# Patient Record
Sex: Female | Born: 1990 | Race: White | Hispanic: No | Marital: Married | Smoking: Never smoker
Health system: Southern US, Community
[De-identification: ages and names within clinical notes are randomized; demographics above are authoritative.]

## PROBLEM LIST (undated history)

## (undated) ENCOUNTER — Encounter (HOSPITAL_COMMUNITY): Payer: Self-pay

## (undated) DIAGNOSIS — Z789 Other specified health status: Secondary | ICD-10-CM

## (undated) HISTORY — PX: WISDOM TOOTH EXTRACTION: SHX21

---

## 2014-02-15 ENCOUNTER — Encounter: Payer: Self-pay | Admitting: Medical

## 2014-02-20 LAB — OB RESULTS CONSOLE GC/CHLAMYDIA
Chlamydia: NEGATIVE
GC PROBE AMP, GENITAL: NEGATIVE

## 2014-03-20 LAB — OB RESULTS CONSOLE HEPATITIS B SURFACE ANTIGEN: Hepatitis B Surface Ag: NEGATIVE

## 2014-03-20 LAB — OB RESULTS CONSOLE ABO/RH: RH Type: POSITIVE

## 2014-03-20 LAB — OB RESULTS CONSOLE RPR: RPR: NONREACTIVE

## 2014-03-20 LAB — OB RESULTS CONSOLE HIV ANTIBODY (ROUTINE TESTING): HIV: NONREACTIVE

## 2014-03-20 LAB — OB RESULTS CONSOLE RUBELLA ANTIBODY, IGM: Rubella: IMMUNE

## 2014-07-18 ENCOUNTER — Inpatient Hospital Stay (HOSPITAL_COMMUNITY)
Admission: AD | Admit: 2014-07-18 | Discharge: 2014-07-18 | Disposition: A | Discharge status: Home / Self Care | Payer: 59 | Source: Ambulatory Visit | Attending: Obstetrics and Gynecology | Admitting: Obstetrics and Gynecology

## 2014-07-18 ENCOUNTER — Encounter (HOSPITAL_COMMUNITY): Payer: Self-pay | Admitting: *Deleted

## 2014-07-18 DIAGNOSIS — N898 Other specified noninflammatory disorders of vagina: Secondary | ICD-10-CM | POA: Insufficient documentation

## 2014-07-18 DIAGNOSIS — O9989 Other specified diseases and conditions complicating pregnancy, childbirth and the puerperium: Secondary | ICD-10-CM | POA: Diagnosis not present

## 2014-07-18 DIAGNOSIS — O26893 Other specified pregnancy related conditions, third trimester: Secondary | ICD-10-CM

## 2014-07-18 DIAGNOSIS — O99891 Other specified diseases and conditions complicating pregnancy: Secondary | ICD-10-CM | POA: Insufficient documentation

## 2014-07-18 HISTORY — DX: Other specified health status: Z78.9

## 2014-07-18 NOTE — MAU Note (Signed)
?   LOF for the past 2 weeks, is becoming more frequent & larger amount of fluid, yellowish tint.  Denies bleeding or pain.

## 2014-07-18 NOTE — MAU Provider Note (Signed)
  History     CSN: [id number]  Arrival date and time: 07/18/14 1653   None     Chief Complaint  Patient presents with  . Rupture of Membranes   HPI This is a 23 y.o. female at 7553w0d who presents with c/o leaking thin fluid for 2 days with yellow color. Denies bleeding or pain. Feels lots of fetal movement.   RN Note:  ? LOF for the past 2 weeks, is becoming more frequent & larger amount of fluid, yellowish tint. Denies bleeding or pain.       OB History   Grav Para Term Preterm Abortions TAB SAB Ect Mult Living   1               Past Medical History  Diagnosis Date  . Medical history non-contributory     Past Surgical History  Procedure Laterality Date  . Wisdom tooth extraction      History reviewed. No pertinent family history.  History  Substance Use Topics  . Smoking status: Never Smoker   . Smokeless tobacco: Not on file  . Alcohol Use: No    Allergies: Allergies not on file  No prescriptions prior to admission    Review of Systems  Constitutional: Negative for fever, chills and malaise/fatigue.  Gastrointestinal: Negative for nausea, vomiting and abdominal pain.  Genitourinary:       Vaginal discharge  Neurological: Negative for dizziness and headaches.   Physical Exam   Blood pressure 106/65, pulse 70, temperature 98.6 F (37 C), temperature source Oral, resp. rate 16.  Physical Exam  Constitutional: She is oriented to person, place, and time. She appears well-developed and well-nourished. No distress.  Cardiovascular: Normal rate.   Respiratory: Effort normal.  GI: Soft. There is no tenderness. There is no rebound and no guarding.  Genitourinary: Uterus normal. Vaginal discharge (thin white fluid in vault, small pool, NEGATIVE ferning) found.  Cervix appears closed   Musculoskeletal: Normal range of motion.  Neurological: She is alert and oriented to person, place, and time.  Skin: Skin is warm and dry.  Psychiatric: She has a normal  mood and affect.   FHR reassuring Some intermittent uterine irritability, not sensed by patient  Bimanual:  Cervix closed Dilation: Closed Effacement (%): Thick Cervical Position: Posterior Presentation: Undeterminable Exam by:: Mayford KnifeWilliams CNM  MAU Course  Procedures  MDM Negative Ferning  Assessment and Plan  A:  SIUP at 5453w0d       Vaginal discharge, no evidence of PPROM  P:  Discharge home       Dr Dareen PianoAnderson notified       Has appt next week in office  [facility] LLCWILLIAMS,Britnie Colville 07/18/2014, 5:38 PM

## 2014-07-18 NOTE — Discharge Instructions (Signed)
Third Trimester of Pregnancy The third trimester is from week 29 through week 42, months 7 through 9. The third trimester is a time when the fetus is growing rapidly. At the end of the ninth month, the fetus is about 20 inches in length and weighs 6-10 pounds.  BODY CHANGES Your body goes through many changes during pregnancy. The changes vary from woman to woman.   Your weight will continue to increase. You can expect to gain 25-35 pounds (11-16 kg) by the end of the pregnancy.  You may begin to get stretch marks on your hips, abdomen, and breasts.  You may urinate more often because the fetus is moving lower into your pelvis and pressing on your bladder.  You may develop or continue to have heartburn as a result of your pregnancy.  You may develop constipation because certain hormones are causing the muscles that push waste through your intestines to slow down.  You may develop hemorrhoids or swollen, bulging veins (varicose veins).  You may have pelvic pain because of the weight gain and pregnancy hormones relaxing your joints between the bones in your pelvis. Backaches may result from overexertion of the muscles supporting your posture.  You may have changes in your hair. These can include thickening of your hair, rapid growth, and changes in texture. Some women also have hair loss during or after pregnancy, or hair that feels dry or thin. Your hair will most likely return to normal after your baby is born.  Your breasts will continue to grow and be tender. A yellow discharge may leak from your breasts called colostrum.  Your belly button may stick out.  You may feel short of breath because of your expanding uterus.  You may notice the fetus "dropping," or moving lower in your abdomen.  You may have a bloody mucus discharge. This usually occurs a few days to a week before labor begins.  Your cervix becomes thin and soft (effaced) near your due date. WHAT TO EXPECT AT YOUR PRENATAL  EXAMS  You will have prenatal exams every 2 weeks until week 36. Then, you will have weekly prenatal exams. During a routine prenatal visit:  You will be weighed to make sure you and the fetus are growing normally.  Your blood pressure is taken.  Your abdomen will be measured to track your baby's growth.  The fetal heartbeat will be listened to.  Any test results from the previous visit will be discussed.  You may have a cervical check near your due date to see if you have effaced. At around 36 weeks, your caregiver will check your cervix. At the same time, your caregiver will also perform a test on the secretions of the vaginal tissue. This test is to determine if a type of bacteria, Group B streptococcus, is present. Your caregiver will explain this further. Your caregiver may ask you:  What your birth plan is.  How you are feeling.  If you are feeling the baby move.  If you have had any abnormal symptoms, such as leaking fluid, bleeding, severe headaches, or abdominal cramping.  If you have any questions. Other tests or screenings that may be performed during your third trimester include:  Blood tests that check for low iron levels (anemia).  Fetal testing to check the health, activity level, and growth of the fetus. Testing is done if you have certain medical conditions or if there are problems during the pregnancy. FALSE LABOR You may feel small, irregular contractions that   eventually go away. These are called Braxton Hicks contractions, or false labor. Contractions may last for hours, days, or even weeks before true labor sets in. If contractions come at regular intervals, intensify, or become painful, it is best to be seen by your caregiver.  SIGNS OF LABOR   Menstrual-like cramps.  Contractions that are 5 minutes apart or less.  Contractions that start on the top of the uterus and spread down to the lower abdomen and back.  A sense of increased pelvic pressure or back  pain.  A watery or bloody mucus discharge that comes from the vagina. If you have any of these signs before the 37th week of pregnancy, call your caregiver right away. You need to go to the hospital to get checked immediately. HOME CARE INSTRUCTIONS   Avoid all smoking, herbs, alcohol, and unprescribed drugs. These chemicals affect the formation and growth of the baby.  Follow your caregiver's instructions regarding medicine use. There are medicines that are either safe or unsafe to take during pregnancy.  Exercise only as directed by your caregiver. Experiencing uterine cramps is a good sign to stop exercising.  Continue to eat regular, healthy meals.  Wear a good support bra for breast tenderness.  Do not use hot tubs, steam rooms, or saunas.  Wear your seat belt at all times when driving.  Avoid raw meat, uncooked cheese, cat litter boxes, and soil used by cats. These carry germs that can cause birth defects in the baby.  Take your prenatal vitamins.  Try taking a stool softener (if your caregiver approves) if you develop constipation. Eat more high-fiber foods, such as fresh vegetables or fruit and whole grains. Drink plenty of fluids to keep your urine clear or pale yellow.  Take warm sitz baths to soothe any pain or discomfort caused by hemorrhoids. Use hemorrhoid cream if your caregiver approves.  If you develop varicose veins, wear support hose. Elevate your feet for 15 minutes, 3-4 times a day. Limit salt in your diet.  Avoid heavy lifting, wear low heal shoes, and practice good posture.  Rest a lot with your legs elevated if you have leg cramps or low back pain.  Visit your dentist if you have not gone during your pregnancy. Use a soft toothbrush to brush your teeth and be gentle when you floss.  A sexual relationship may be continued unless your caregiver directs you otherwise.  Do not travel far distances unless it is absolutely necessary and only with the approval  of your caregiver.  Take prenatal classes to understand, practice, and ask questions about the labor and delivery.  Make a trial run to the hospital.  Pack your hospital bag.  Prepare the baby's nursery.  Continue to go to all your prenatal visits as directed by your caregiver. SEEK MEDICAL CARE IF:  You are unsure if you are in labor or if your water has broken.  You have dizziness.  You have mild pelvic cramps, pelvic pressure, or nagging pain in your abdominal area.  You have persistent nausea, vomiting, or diarrhea.  You have a bad smelling vaginal discharge.  You have pain with urination. SEEK IMMEDIATE MEDICAL CARE IF:   You have a fever.  You are leaking fluid from your vagina.  You have spotting or bleeding from your vagina.  You have severe abdominal cramping or pain.  You have rapid weight loss or gain.  You have shortness of breath with chest pain.  You notice sudden or extreme swelling   of your face, hands, ankles, feet, or legs.  You have not felt your baby move in over an hour.  You have severe headaches that do not go away with medicine.  You have vision changes. Document Released: 11/23/2001 Document Revised: 12/04/2013 Document Reviewed: 01/30/2013 ExitCare Patient Information 2015 [employer]. This information is not intended to replace advice given to you by your health care provider. Make sure you discuss any questions you have with your health care provider.  

## 2014-09-10 LAB — OB RESULTS CONSOLE GBS: GBS: NEGATIVE

## 2014-10-14 ENCOUNTER — Encounter (HOSPITAL_COMMUNITY): Payer: Self-pay | Admitting: *Deleted

## 2014-10-17 ENCOUNTER — Encounter (HOSPITAL_COMMUNITY): Payer: Self-pay

## 2014-10-17 ENCOUNTER — Inpatient Hospital Stay (HOSPITAL_COMMUNITY)
Admission: RE | Admit: 2014-10-17 | Discharge: 2014-10-20 | DRG: 775 | Disposition: A | Discharge status: Home / Self Care | Payer: BC Managed Care – PPO | Source: Ambulatory Visit | Attending: Obstetrics & Gynecology | Admitting: Obstetrics & Gynecology

## 2014-10-17 DIAGNOSIS — Z8759 Personal history of other complications of pregnancy, childbirth and the puerperium: Secondary | ICD-10-CM

## 2014-10-17 DIAGNOSIS — Z3A41 41 weeks gestation of pregnancy: Secondary | ICD-10-CM | POA: Diagnosis present

## 2014-10-17 DIAGNOSIS — IMO0001 Reserved for inherently not codable concepts without codable children: Secondary | ICD-10-CM

## 2014-10-17 DIAGNOSIS — O480 Post-term pregnancy: Principal | ICD-10-CM | POA: Diagnosis present

## 2014-10-17 LAB — CBC
HCT: 34.6 % — ABNORMAL LOW (ref 36.0–46.0)
Hemoglobin: 11.8 g/dL — ABNORMAL LOW (ref 12.0–15.0)
MCH: 31.7 pg (ref 26.0–34.0)
MCHC: 34.1 g/dL (ref 30.0–36.0)
MCV: 93 fL (ref 78.0–100.0)
PLATELETS: 215 10*3/uL (ref 150–400)
RBC: 3.72 MIL/uL — ABNORMAL LOW (ref 3.87–5.11)
RDW: 13.3 % (ref 11.5–15.5)
WBC: 15.6 10*3/uL — ABNORMAL HIGH (ref 4.0–10.5)

## 2014-10-17 MED ORDER — FLEET ENEMA 7-19 GM/118ML RE ENEM: 1.0000 | ENEMA | RECTAL | Status: DC | PRN

## 2014-10-17 MED ORDER — LACTATED RINGERS IV SOLN
INTRAVENOUS | Status: DC
  Administered 2014-10-17: 22:00:00 via INTRAVENOUS

## 2014-10-17 MED ORDER — OXYCODONE-ACETAMINOPHEN 5-325 MG PO TABS: 1.0000 | ORAL_TABLET | ORAL | Status: DC | PRN

## 2014-10-17 MED ORDER — CITRIC ACID-SODIUM CITRATE 334-500 MG/5ML PO SOLN: 30.0000 mL | ORAL | Status: DC | PRN

## 2014-10-17 MED ORDER — OXYCODONE-ACETAMINOPHEN 5-325 MG PO TABS: 2.0000 | ORAL_TABLET | ORAL | Status: DC | PRN

## 2014-10-17 MED ORDER — ONDANSETRON HCL 4 MG/2ML IJ SOLN: 4.0000 mg | Freq: Four times a day (QID) | INTRAMUSCULAR | Status: DC | PRN

## 2014-10-17 MED ORDER — SODIUM CHLORIDE 0.9 % IJ SOLN: 3.0000 mL | Freq: Two times a day (BID) | INTRAMUSCULAR | Status: DC

## 2014-10-17 MED ORDER — TERBUTALINE SULFATE 1 MG/ML IJ SOLN: 0.2500 mg | Freq: Once | INTRAMUSCULAR | Status: AC | PRN

## 2014-10-17 MED ORDER — SODIUM CHLORIDE 0.9 % IJ SOLN: 3.0000 mL | INTRAMUSCULAR | Status: DC | PRN

## 2014-10-17 MED ORDER — ZOLPIDEM TARTRATE 5 MG PO TABS
5.0000 mg | ORAL_TABLET | Freq: Every evening | ORAL | Status: DC | PRN
  Administered 2014-10-17: 5 mg via ORAL
  Filled 2014-10-17: qty 1

## 2014-10-17 MED ORDER — ACETAMINOPHEN 325 MG PO TABS: 650.0000 mg | ORAL_TABLET | ORAL | Status: DC | PRN

## 2014-10-17 MED ORDER — LACTATED RINGERS IV SOLN
500.0000 mL | INTRAVENOUS | Status: DC | PRN
  Administered 2014-10-18: 500 mL via INTRAVENOUS

## 2014-10-17 MED ORDER — MISOPROSTOL 25 MCG QUARTER TABLET
25.0000 ug | ORAL_TABLET | ORAL | Status: DC | PRN
  Administered 2014-10-17: 25 ug via VAGINAL
  Filled 2014-10-17 (×2): qty 0.25

## 2014-10-17 MED ORDER — SODIUM CHLORIDE 0.9 % IV SOLN: 250.0000 mL | INTRAVENOUS | Status: DC | PRN

## 2014-10-17 MED ORDER — OXYTOCIN BOLUS FROM INFUSION
500.0000 mL | INTRAVENOUS | Status: DC
  Administered 2014-10-18: 500 mL via INTRAVENOUS

## 2014-10-17 MED ORDER — OXYTOCIN 40 UNITS IN LACTATED RINGERS INFUSION - SIMPLE MED
62.5000 mL/h | INTRAVENOUS | Status: DC
  Filled 2014-10-17: qty 1000

## 2014-10-17 MED ORDER — OXYTOCIN 40 UNITS IN LACTATED RINGERS INFUSION - SIMPLE MED: 1.0000 m[IU]/min | INTRAVENOUS | Status: DC

## 2014-10-17 MED ORDER — LIDOCAINE HCL (PF) 1 % IJ SOLN
30.0000 mL | INTRAMUSCULAR | Status: DC | PRN
  Filled 2014-10-17: qty 30

## 2014-10-17 NOTE — Plan of Care (Signed)
Problem: Consults Goal: Automotive engineer Patient Education (See Patient Education module for education specifics.) Outcome: Completed/Met Date Met:  10/17/14 Goal: Birthing Suites Patient Information Press F2 to bring up selections list Outcome: Completed/Met Date Met:  10/17/14  Inpatient Induction Goal: Prenatal labs/testing reviewed upon admission Outcome: Completed/Met Date Met:  10/17/14 Goal: Orientation to unit: Plan of Care Outcome: Completed/Met Date Met:  10/17/14 Goal: Orientation to unit: Room Outcome: Completed/Met Date Met:  10/17/14 Goal: Orientation to unit: [place] (smoking, visitation, chaplain services, helpline) Outcome: Completed/Met Date Met:  10/17/14 Goal: Orientation to unit: Other (Specify with a note) Outcome: Completed/Met Date Met:  10/17/14  Problem: Phase I Progression Outcomes Goal: Assess per MD/Nurse,Routine-VS,FHR,UC,Head to Toe assess Outcome: Completed/Met Date Met:  10/17/14 Goal: Obtain and review prenatal records Outcome: Progressing Goal: Pain controlled with appropriate interventions Outcome: Progressing Goal: Adequate progression of labor Outcome: Progressing Goal: Induction meds as ordered Outcome: Completed/Met Date Met:  10/17/14 Goal: IV Pain medications as ordered Outcome: Progressing Goal: FHR checked 5 minutes after meds (ROM) Rupture of Membranes Outcome: Progressing Goal: Assess/evaluate cervical exam prn (q2hrs in active phase) Outcome: Progressing Goal: Assess/evaluate notify MD when complete/8 cm Outcome: Progressing Goal: Assess/evaluate effectiveness of pushing Outcome: Progressing Goal: Complete instrument count Outcome: Progressing Goal: Appropriate patient level of comfort Outcome: Progressing Goal: Medical plan of care initiated within 2 hrs of admission Outcome: Progressing

## 2014-10-18 ENCOUNTER — Inpatient Hospital Stay (HOSPITAL_COMMUNITY): Payer: BC Managed Care – PPO | Admitting: Anesthesiology

## 2014-10-18 ENCOUNTER — Encounter (HOSPITAL_COMMUNITY): Payer: Self-pay

## 2014-10-18 DIAGNOSIS — IMO0001 Reserved for inherently not codable concepts without codable children: Secondary | ICD-10-CM

## 2014-10-18 LAB — RPR

## 2014-10-18 LAB — ABO/RH: ABO/RH(D): O POS

## 2014-10-18 LAB — TYPE AND SCREEN
ABO/RH(D): O POS
Antibody Screen: NEGATIVE

## 2014-10-18 MED ORDER — PHENYLEPHRINE 40 MCG/ML (10ML) SYRINGE FOR IV PUSH (FOR BLOOD PRESSURE SUPPORT): 80.0000 ug | PREFILLED_SYRINGE | INTRAVENOUS | Status: DC | PRN

## 2014-10-18 MED ORDER — BENZOCAINE-MENTHOL 20-0.5 % EX AERO
1.0000 "application " | INHALATION_SPRAY | CUTANEOUS | Status: DC | PRN
  Filled 2014-10-18: qty 56

## 2014-10-18 MED ORDER — LIDOCAINE HCL (PF) 1 % IJ SOLN
INTRAMUSCULAR | Status: DC | PRN
  Administered 2014-10-18 (×2): 8 mL

## 2014-10-18 MED ORDER — WITCH HAZEL-GLYCERIN EX PADS: 1.0000 "application " | MEDICATED_PAD | CUTANEOUS | Status: DC | PRN

## 2014-10-18 MED ORDER — FENTANYL 2.5 MCG/ML BUPIVACAINE 1/10 % EPIDURAL INFUSION (WH - ANES)
INTRAMUSCULAR | Status: DC | PRN
  Administered 2014-10-18: 14 mL/h via EPIDURAL

## 2014-10-18 MED ORDER — LANOLIN HYDROUS EX OINT: TOPICAL_OINTMENT | CUTANEOUS | Status: DC | PRN

## 2014-10-18 MED ORDER — DIPHENHYDRAMINE HCL 50 MG/ML IJ SOLN: 12.5000 mg | INTRAMUSCULAR | Status: DC | PRN

## 2014-10-18 MED ORDER — FENTANYL 2.5 MCG/ML BUPIVACAINE 1/10 % EPIDURAL INFUSION (WH - ANES)
14.0000 mL/h | INTRAMUSCULAR | Status: DC | PRN
  Administered 2014-10-18: 14 mL/h via EPIDURAL
  Filled 2014-10-18 (×3): qty 125

## 2014-10-18 MED ORDER — IBUPROFEN 600 MG PO TABS
600.0000 mg | ORAL_TABLET | Freq: Four times a day (QID) | ORAL | Status: DC
  Administered 2014-10-18 – 2014-10-20 (×7): 600 mg via ORAL
  Filled 2014-10-18 (×7): qty 1

## 2014-10-18 MED ORDER — SENNOSIDES-DOCUSATE SODIUM 8.6-50 MG PO TABS
2.0000 | ORAL_TABLET | ORAL | Status: DC
  Administered 2014-10-18 – 2014-10-19 (×2): 2 via ORAL
  Filled 2014-10-18 (×2): qty 2

## 2014-10-18 MED ORDER — TETANUS-DIPHTH-ACELL PERTUSSIS 5-2.5-18.5 LF-MCG/0.5 IM SUSP
0.5000 mL | Freq: Once | INTRAMUSCULAR | Status: AC
  Administered 2014-10-20: 0.5 mL via INTRAMUSCULAR
  Filled 2014-10-18: qty 0.5

## 2014-10-18 MED ORDER — SIMETHICONE 80 MG PO CHEW: 80.0000 mg | CHEWABLE_TABLET | ORAL | Status: DC | PRN

## 2014-10-18 MED ORDER — PHENYLEPHRINE 40 MCG/ML (10ML) SYRINGE FOR IV PUSH (FOR BLOOD PRESSURE SUPPORT)
80.0000 ug | PREFILLED_SYRINGE | INTRAVENOUS | Status: DC | PRN
  Filled 2014-10-18: qty 10

## 2014-10-18 MED ORDER — LACTATED RINGERS IV SOLN: 500.0000 mL | Freq: Once | INTRAVENOUS | Status: DC

## 2014-10-18 MED ORDER — ONDANSETRON HCL 4 MG PO TABS: 4.0000 mg | ORAL_TABLET | ORAL | Status: DC | PRN

## 2014-10-18 MED ORDER — EPHEDRINE 5 MG/ML INJ: 10.0000 mg | INTRAVENOUS | Status: DC | PRN

## 2014-10-18 MED ORDER — OXYCODONE-ACETAMINOPHEN 5-325 MG PO TABS
2.0000 | ORAL_TABLET | ORAL | Status: DC | PRN
Start: 2014-10-18 — End: 2014-10-20

## 2014-10-18 MED ORDER — OXYCODONE-ACETAMINOPHEN 5-325 MG PO TABS: 1.0000 | ORAL_TABLET | ORAL | Status: DC | PRN

## 2014-10-18 MED ORDER — PRENATAL MULTIVITAMIN CH
1.0000 | ORAL_TABLET | Freq: Every day | ORAL | Status: DC
  Administered 2014-10-19: 1 via ORAL
  Filled 2014-10-18: qty 1

## 2014-10-18 MED ORDER — ZOLPIDEM TARTRATE 5 MG PO TABS: 5.0000 mg | ORAL_TABLET | Freq: Every evening | ORAL | Status: DC | PRN

## 2014-10-18 MED ORDER — OXYTOCIN 40 UNITS IN LACTATED RINGERS INFUSION - SIMPLE MED: 62.5000 mL/h | INTRAVENOUS | Status: DC | PRN

## 2014-10-18 MED ORDER — ONDANSETRON HCL 4 MG/2ML IJ SOLN: 4.0000 mg | INTRAMUSCULAR | Status: DC | PRN

## 2014-10-18 MED ORDER — EPHEDRINE 5 MG/ML INJ
10.0000 mg | INTRAVENOUS | Status: DC | PRN
  Administered 2014-10-18: 10 mg via INTRAVENOUS
  Filled 2014-10-18: qty 4

## 2014-10-18 MED ORDER — DIPHENHYDRAMINE HCL 25 MG PO CAPS: 25.0000 mg | ORAL_CAPSULE | Freq: Four times a day (QID) | ORAL | Status: DC | PRN

## 2014-10-18 MED ORDER — DIBUCAINE 1 % RE OINT: 1.0000 "application " | TOPICAL_OINTMENT | RECTAL | Status: DC | PRN

## 2014-10-18 NOTE — Anesthesia Preprocedure Evaluation (Signed)

## 2014-10-18 NOTE — Anesthesia Postprocedure Evaluation (Signed)
Anesthesia Post Note  Patient: Jessica Heath  Procedure(s) Performed: * No procedures listed *  Anesthesia type: Epidural  Patient location: Mother/Baby  Post pain: Pain level controlled  Post assessment: Post-op Vital signs reviewed  Last Vitals:  Filed Vitals:   10/18/14 1535  BP: 112/55  Pulse: 61  Temp: 36.7 C  Resp: 18    Post vital signs: Reviewed  Level of consciousness:alert  Complications: No apparent anesthesia complications

## 2014-10-18 NOTE — Plan of Care (Signed)
Problem: Consults Goal: Postpartum Patient Education (See Patient Education module for education specifics.) Outcome: Completed/Met Date Met:  10/18/14  Problem: Phase I Progression Outcomes Goal: Pain controlled with appropriate interventions Outcome: Completed/Met Date Met:  10/18/14 Goal: Voiding adequately Outcome: Completed/Met Date Met:  10/18/14 Goal: OOB as tolerated unless otherwise ordered Outcome: Completed/Met Date Met:  10/18/14 Goal: VS, stable, temp < 100.4 degrees F Outcome: Completed/Met Date Met:  10/18/14 Goal: Initial discharge plan identified Outcome: Completed/Met Date Met:  10/18/14 Goal: Other Phase I Outcomes/Goals Outcome: Completed/Met Date Met:  10/18/14     

## 2014-10-18 NOTE — Anesthesia Procedure Notes (Signed)
Epidural Patient location during procedure: OB Start time: 10/18/2014 3:29 AM End time: 10/18/2014 3:33 AM  Staffing Anesthesiologist: Leilani AbleHATCHETT, Sabryn Preslar  Preanesthetic Checklist Completed: patient identified, surgical consent, pre-op evaluation, timeout performed, IV checked, risks and benefits discussed and monitors and equipment checked  Epidural Patient position: sitting Prep: site prepped and draped and DuraPrep Patient monitoring: continuous pulse ox and blood pressure Approach: midline Location: L3-L4 Injection technique: LOR air  Needle:  Needle type: Tuohy  Needle gauge: 17 G Needle length: 9 cm and 9 Needle insertion depth: 5 cm cm Catheter type: closed end flexible Catheter size: 19 Gauge Catheter at skin depth: 10 cm Test dose: negative and Other  Assessment Sensory level: T9 Events: blood not aspirated, injection not painful, no injection resistance, negative IV test and no paresthesia  Additional Notes Reason for block:procedure for pain

## 2014-10-18 NOTE — H&P (Signed)
23 y.o. 41 weeks  G1P0 comes in for postdates induction.  Otherwise has good fetal movement and no bleeding.  History reviewed. No pertinent past medical history. History reviewed. No pertinent past surgical history.  OB History  Gravida Para Term Preterm AB SAB TAB Ectopic Multiple Living  1             # Outcome Date GA Lbr Len/2nd Weight Sex Delivery Anes PTL Lv  1 Current               History   Social History  . Marital Status: Married    Spouse Name: N/A    Number of Children: N/A  . Years of Education: N/A   Occupational History  . Not on file.   Social History Main Topics  . Smoking status: Never Smoker   . Smokeless tobacco: Not on file  . Alcohol Use: No  . Drug Use: No  . Sexual Activity: Yes   Other Topics Concern  . Not on file   Social History Narrative  . No narrative on file   Review of patient's allergies indicates no known allergies.    Prenatal Transfer Tool  Maternal Diabetes: No Genetic Screening: Declined Maternal Ultrasounds/Referrals: Normal Fetal Ultrasounds or other Referrals:  None Maternal Substance Abuse:  No Significant Maternal Medications:  None Significant Maternal Lab Results: None  Other PNC: uncomplicated.    Filed Vitals:   10/18/14 0153  BP: 107/73  Pulse: 69  Temp: 98.7 F (37.1 C)  Resp: 20     Lungs/Cor:  NAD Abdomen:  soft, gravid Ex:  no cords, erythema SVE:  6/90/-2, Meconium fluid per nurse after one cytotec FHTs:  120s, good STV, NST R Toco:  q3   A/P   Term induction for post dates.   GBS NEG.  Mayrene Bastarache A

## 2014-10-18 NOTE — Plan of Care (Signed)
Problem: Phase I Progression Outcomes Goal: Obtain and review prenatal records Outcome: Completed/Met Date Met:  10/18/14 Goal: Pain controlled with appropriate interventions Outcome: Completed/Met Date Met:  10/18/14 Goal: Adequate progression of labor Outcome: Completed/Met Date Met:  10/18/14 Goal: IV Pain medications as ordered Outcome: Not Applicable Date Met:  47/07/61 Goal: FHR checked 5 minutes after meds (ROM) Rupture of Membranes Outcome: Completed/Met Date Met:  10/18/14 Goal: Assess/evaluate cervical exam prn (q2hrs in active phase) Outcome: Completed/Met Date Met:  10/18/14 Goal: Assess/evaluate notify MD when complete/8 cm Outcome: Completed/Met Date Met:  10/18/14 Goal: Appropriate patient level of comfort Outcome: Completed/Met Date Met:  10/18/14 Goal: Medical plan of care initiated within 2 hrs of admission Outcome: Completed/Met Date Met:  10/18/14

## 2014-10-19 LAB — CBC
HCT: 31.5 % — ABNORMAL LOW (ref 36.0–46.0)
Hemoglobin: 11 g/dL — ABNORMAL LOW (ref 12.0–15.0)
MCH: 32.5 pg (ref 26.0–34.0)
MCHC: 34.9 g/dL (ref 30.0–36.0)
MCV: 93.2 fL (ref 78.0–100.0)
PLATELETS: 191 10*3/uL (ref 150–400)
RBC: 3.38 MIL/uL — ABNORMAL LOW (ref 3.87–5.11)
RDW: 13.6 % (ref 11.5–15.5)
WBC: 21.5 10*3/uL — ABNORMAL HIGH (ref 4.0–10.5)

## 2014-10-19 NOTE — Progress Notes (Signed)
Post Partum Day 1 Subjective: no complaints, up ad lib, voiding, tolerating PO, + flatus and breast feeding. Mother and baby are bonding well  Objective: Blood pressure 96/49, pulse 70, temperature 98.4 F (36.9 C), temperature source Oral, resp. rate 18, height 5\' 2"  (1.575 m), weight 64.411 kg (142 lb), SpO2 100 %, unknown if currently breastfeeding.  Physical Exam:  General: alert, appears stated age and no distress Lochia: appropriate Uterine Fundus: firm DVT Evaluation: No evidence of DVT seen on physical exam. Negative Homan's sign. No cords or calf tenderness.   Recent Labs  10/17/14 2210  HGB 11.8*  HCT 34.6*    Assessment/Plan: Plan for discharge tomorrow, Breastfeeding and Circumcision prior to discharge   LOS: 2 days   Fraida Veldman STACIA 10/19/2014, 5:54 AM

## 2014-10-19 NOTE — Lactation Note (Signed)
This note was copied from the chart of [name]. Lactation Consultation Note  Patient Name: Jessica RockersBoy Emmanuelle Umbaugh ZOXWR'UToday's Date: 10/19/2014 Reason for consult: Follow-up assessment Baby 30 hours of life. Mom reports baby "chomping" at breasts, using gums to attempt to nurse. Discussed with mother that baby is able to extend tongue across gumline, but only able to lift tongue slightly due to what appears to be a tight/short frenulum. Enc parents to discuss with pediatrician if nipple/breast discomfort continues. Fitted mom with a #20 NS, given a #24 with instructions. Baby still too sleepy to nurse. Assisted parents to spoon feed baby 3 mls of hand expressed colostrum. Baby tolerated well. Left curve-tipped syringe with parents and demonstrated how to put colostrum in NS when baby awake and willing to suckle at breast. Enc mom to attempt to nurse first, with feeding cues. Then spoon feed EBM if baby still to sleepy to nurse. Enc mom to use hand pump for additional stimulation to enc good supply while using NS. Discussed that NS a temporary measure, and need to keep attempting to latch baby directly to breast, especially when milk comes in. Discussed need to follow baby's weight carefully while using NS. Mom aware of OP/BFSG and LC phone line services. Discussed returning to work and nursing/pumping. Enc mom to call insurance company about DEBP, and mom aware of [facility] DEBP options. Patient's MBU RN [name] in room during assessment and interventions.   Maternal Data    Feeding Feeding Type: Breast Milk Length of feed: 0 min  LATCH Score/Interventions Latch: Too sleepy or reluctant, no latch achieved, no sucking elicited. (Baby circumcised earlier, still sleepy.) Intervention(s): Waking techniques;Skin to skin Intervention(s): Adjust position;Assist with latch;Breast compression  Audible Swallowing: None Intervention(s): Hand expression  Type of Nipple: Everted at rest and after  stimulation  Comfort (Breast/Nipple): Soft / non-tender     Hold (Positioning): Assistance needed to correctly position infant at breast and maintain latch. Intervention(s): Breastfeeding basics reviewed;Support Pillows;Position options  LATCH Score: 5  Lactation Tools Discussed/Used     Consult Status Consult Status: Follow-up Date: 10/20/14 Follow-up type: In-patient    [name], [name] 10/19/2014, 8:12 PM

## 2014-10-19 NOTE — Plan of Care (Signed)
Problem: Discharge Progression Outcomes Goal: Activity appropriate for discharge plan Outcome: Completed/Met Date Met:  10/19/14 Goal: Tolerating diet Outcome: Completed/Met Date Met:  10/19/14 Goal: Pain controlled with appropriate interventions Outcome: Completed/Met Date Met:  10/19/14

## 2014-10-19 NOTE — Plan of Care (Signed)
Problem: Phase II Progression Outcomes Goal: Pain controlled on oral analgesia Outcome: Not Applicable Date Met:  83/07/46 Goal: Progress activity as tolerated unless otherwise ordered Outcome: Completed/Met Date Met:  10/19/14 Goal: Afebrile, VS remain stable Outcome: Completed/Met Date Met:  10/19/14 Goal: Tolerating diet Outcome: Completed/Met Date Met:  10/19/14 Goal: Other Phase II Outcomes/Goals Outcome: Completed/Met Date Met:  10/19/14

## 2014-10-20 MED ORDER — SOD PICOSULFATE-MAG OX-CIT ACD 10-3.5-12 MG-GM-GM PO PACK: 1.0000 | PACK | Freq: Every day | ORAL | Status: DC | PRN

## 2014-10-20 MED ORDER — IBUPROFEN 600 MG PO TABS: 600.0000 mg | ORAL_TABLET | Freq: Four times a day (QID) | ORAL | Status: DC | PRN

## 2014-10-20 NOTE — Progress Notes (Signed)
Post Partum Day 2 Subjective: no complaints, up ad lib, voiding, tolerating PO, + flatus and breast feeding  Objective: Blood pressure 115/59, pulse 65, temperature 97.7 F (36.5 C), temperature source Oral, resp. rate 18, height 5\' 2"  (1.575 m), weight 64.411 kg (142 lb), SpO2 98 %, unknown if currently breastfeeding.  Physical Exam:  General: alert, cooperative and no distress Lochia: appropriate Uterine Fundus: firm DVT Evaluation: No evidence of DVT seen on physical exam. Negative Homan's sign. No cords or calf tenderness.   Recent Labs  10/17/14 2210 10/19/14 0553  HGB 11.8* 11.0*  HCT 34.6* 31.5*    Assessment/Plan: Discharge home, Breastfeeding, Circumcision done yesterday and Contraception will discuss at post partum visit   LOS: 3 days   Hill Mackie STACIA 10/20/2014, 10:04 AM

## 2014-10-20 NOTE — Lactation Note (Addendum)
This note was copied from the chart of Jessica Heath. Lactation Consultation Note  Attempted breastfeeding with #20NS but baby had emesis x2. Reviewed how to use bulb syringe. Noted posterior tight frenulum.  Parents are aware. Reviewed being sure baby is STS when feeding, burp baby and watch for breastmilk in NS. Mother has 2 hand pumps she plans to use until her DEBP pump arrives. Suggest she post pump 4-6 times a day for 15 min to stimulate her milk supply. Reviewed monitoring for voids/stools, engorgement and provided mother with another #20NS. Offered outpt appt but mother states she will call if needed.  Patient Name: Jessica Heath'UToday's Date: 10/20/2014 Reason for consult: Follow-up assessment   Maternal Data    Feeding Feeding Type: Breast Fed Length of feed: 12 min  LATCH Score/Interventions                      Lactation Tools Discussed/Used     Consult Status Consult Status: Complete    Hardie PulleyBerkelhammer, Ruth Boschen 10/20/2014, 11:32 AM

## 2014-10-20 NOTE — Plan of Care (Signed)
Problem: Discharge Progression Outcomes Goal: Barriers To Progression Addressed/Resolved Outcome: Not Applicable Date Met:  71/27/87 Goal: Complications resolved/controlled Outcome: Completed/Met Date Met:  10/20/14 Goal: Afebrile, VS remain stable at discharge Outcome: Completed/Met Date Met:  10/20/14 Goal: Discharge plan in place and appropriate Outcome: Completed/Met Date Met:  10/20/14 Goal: Other Discharge Outcomes/Goals Outcome: Not Applicable Date Met:  18/36/72

## 2014-10-20 NOTE — Discharge Summary (Signed)
Obstetric Discharge Summary Reason for Admission: induction of labor Prenatal Procedures: NST and ultrasound Intrapartum Procedures: spontaneous vaginal delivery Postpartum Procedures: none Complications-Operative and Postpartum: 1st degree perineal laceration HEMOGLOBIN  Date Value Ref Range Status  10/19/2014 11.0* 12.0 - 15.0 g/dL Final   HCT  Date Value Ref Range Status  10/19/2014 31.5* 36.0 - 46.0 % Final    Physical Exam:  General: alert, cooperative and no distress Lochia: appropriate Uterine Fundus: firm DVT Evaluation: No evidence of DVT seen on physical exam. Negative Homan's sign. No cords or calf tenderness.   Discharge Diagnoses: Term Pregnancy-delivered  Discharge Information: Date: 10/20/2014 Activity: pelvic rest Diet: routine Medications: PNV, Ibuprofen and Colace Condition: stable Instructions: refer to practice specific booklet Discharge to: home   Newborn Data: Live born female  Birth Weight: 7 lb 10.9 oz (3485 g) APGAR: 3, 6  Home with mother.  Essie HartINN, Geralyn Figiel STACIA 10/20/2014, 10:07 AM

## 2015-05-23 ENCOUNTER — Encounter (HOSPITAL_COMMUNITY): Payer: Self-pay | Admitting: *Deleted

## 2016-12-20 ENCOUNTER — Encounter (HOSPITAL_COMMUNITY): Payer: Self-pay | Admitting: *Deleted

## 2017-07-07 ENCOUNTER — Other Ambulatory Visit: Payer: Self-pay

## 2017-07-07 LAB — OB RESULTS CONSOLE GC/CHLAMYDIA
CHLAMYDIA, DNA PROBE: NEGATIVE
Gonorrhea: NEGATIVE

## 2017-07-08 LAB — OB RESULTS CONSOLE HEPATITIS B SURFACE ANTIGEN: HEP B S AG: NEGATIVE

## 2017-07-08 LAB — OB RESULTS CONSOLE HIV ANTIBODY (ROUTINE TESTING): HIV: NONREACTIVE

## 2017-07-08 LAB — OB RESULTS CONSOLE RUBELLA ANTIBODY, IGM: Rubella: IMMUNE

## 2017-09-30 ENCOUNTER — Other Ambulatory Visit (HOSPITAL_COMMUNITY): Payer: Self-pay | Admitting: Obstetrics & Gynecology

## 2017-09-30 DIAGNOSIS — Z3A22 22 weeks gestation of pregnancy: Secondary | ICD-10-CM

## 2017-09-30 DIAGNOSIS — Z3689 Encounter for other specified antenatal screening: Secondary | ICD-10-CM

## 2017-09-30 DIAGNOSIS — O43122 Velamentous insertion of umbilical cord, second trimester: Secondary | ICD-10-CM

## 2017-10-04 ENCOUNTER — Encounter (HOSPITAL_COMMUNITY): Payer: Self-pay | Admitting: Obstetrics & Gynecology

## 2017-10-07 ENCOUNTER — Encounter (HOSPITAL_COMMUNITY): Payer: Self-pay | Admitting: *Deleted

## 2017-10-11 ENCOUNTER — Encounter (HOSPITAL_COMMUNITY): Payer: Self-pay

## 2017-10-11 ENCOUNTER — Ambulatory Visit (HOSPITAL_COMMUNITY)
Admission: RE | Admit: 2017-10-11 | Discharge: 2017-10-11 | Disposition: A | Discharge status: Home / Self Care | Payer: Commercial Managed Care - PPO | Source: Ambulatory Visit | Attending: Obstetrics & Gynecology | Admitting: Obstetrics & Gynecology

## 2017-10-11 ENCOUNTER — Ambulatory Visit (HOSPITAL_COMMUNITY): Admission: RE | Admit: 2017-10-11 | Payer: Commercial Managed Care - PPO | Source: Ambulatory Visit

## 2017-10-11 ENCOUNTER — Inpatient Hospital Stay (HOSPITAL_COMMUNITY): Admission: RE | Admit: 2017-10-11 | Payer: 59 | Source: Ambulatory Visit

## 2017-10-11 DIAGNOSIS — Z3A22 22 weeks gestation of pregnancy: Secondary | ICD-10-CM | POA: Diagnosis not present

## 2017-10-11 DIAGNOSIS — Z3689 Encounter for other specified antenatal screening: Secondary | ICD-10-CM | POA: Diagnosis present

## 2017-10-11 DIAGNOSIS — O43122 Velamentous insertion of umbilical cord, second trimester: Secondary | ICD-10-CM | POA: Diagnosis present

## 2017-10-11 NOTE — Addendum Note (Signed)
Encounter addended by: Lenise ArenaBazemore, Makenize Messman, RDMS on: 10/11/2017 10:43 AM<BR>    Actions taken: Imaging Exam ended

## 2017-11-17 LAB — OB RESULTS CONSOLE RPR: RPR: NONREACTIVE

## 2017-12-13 NOTE — L&D Delivery Note (Signed)
Patient received pitocin augmentation and proceeded as expected to complete. Patient was C/C/+2 and pushed for approximately 20 minutes with epidural.    NSVD female infant, Apgars 9/9, weight pending.   The patient had no laceration. Fundus was firm. EBL was expected amount. Placenta was delivered intact. Vagina was clear.  Delayed cord clamping done for 30-60 seconds while warming baby. Baby was vigorous and doing skin to skin with mother.  Philip AspenALLAHAN, Jessica Heath

## 2018-01-12 LAB — OB RESULTS CONSOLE GBS: GBS: NEGATIVE

## 2018-02-08 ENCOUNTER — Inpatient Hospital Stay (HOSPITAL_COMMUNITY)
Admission: AD | Admit: 2018-02-08 | Discharge: 2018-02-10 | DRG: 807 | Disposition: A | Discharge status: Home / Self Care | Payer: Commercial Managed Care - PPO | Source: Ambulatory Visit | Attending: Obstetrics and Gynecology | Admitting: Obstetrics and Gynecology

## 2018-02-08 ENCOUNTER — Inpatient Hospital Stay (HOSPITAL_COMMUNITY): Payer: Commercial Managed Care - PPO | Admitting: Anesthesiology

## 2018-02-08 ENCOUNTER — Encounter (HOSPITAL_COMMUNITY): Payer: Self-pay

## 2018-02-08 DIAGNOSIS — Z3483 Encounter for supervision of other normal pregnancy, third trimester: Secondary | ICD-10-CM | POA: Diagnosis present

## 2018-02-08 DIAGNOSIS — Z3A39 39 weeks gestation of pregnancy: Secondary | ICD-10-CM

## 2018-02-08 DIAGNOSIS — O4292 Full-term premature rupture of membranes, unspecified as to length of time between rupture and onset of labor: Secondary | ICD-10-CM

## 2018-02-08 DIAGNOSIS — R52 Pain, unspecified: Secondary | ICD-10-CM

## 2018-02-08 LAB — CBC
HEMATOCRIT: 35 % — AB (ref 36.0–46.0)
Hemoglobin: 11.8 g/dL — ABNORMAL LOW (ref 12.0–15.0)
MCH: 30.8 pg (ref 26.0–34.0)
MCHC: 33.7 g/dL (ref 30.0–36.0)
MCV: 91.4 fL (ref 78.0–100.0)
Platelets: 240 10*3/uL (ref 150–400)
RBC: 3.83 MIL/uL — ABNORMAL LOW (ref 3.87–5.11)
RDW: 12.9 % (ref 11.5–15.5)
WBC: 16.3 10*3/uL — AB (ref 4.0–10.5)

## 2018-02-08 LAB — TYPE AND SCREEN
ABO/RH(D): O POS
Antibody Screen: NEGATIVE

## 2018-02-08 LAB — ABO/RH: ABO/RH(D): O POS

## 2018-02-08 MED ORDER — OXYTOCIN BOLUS FROM INFUSION
500.0000 mL | Freq: Once | INTRAVENOUS | Status: AC
  Administered 2018-02-08: 500 mL via INTRAVENOUS

## 2018-02-08 MED ORDER — COCONUT OIL OIL
1.0000 "application " | TOPICAL_OIL | Status: DC | PRN
  Filled 2018-02-08 (×2): qty 120

## 2018-02-08 MED ORDER — LIDOCAINE HCL (PF) 1 % IJ SOLN
30.0000 mL | INTRAMUSCULAR | Status: DC | PRN
Start: 2018-02-08 — End: 2018-02-08

## 2018-02-08 MED ORDER — BENZOCAINE-MENTHOL 20-0.5 % EX AERO
1.0000 "application " | INHALATION_SPRAY | CUTANEOUS | Status: DC | PRN
  Administered 2018-02-08: 1 via TOPICAL
  Filled 2018-02-08 (×3): qty 56

## 2018-02-08 MED ORDER — FENTANYL CITRATE (PF) 100 MCG/2ML IJ SOLN
INTRAMUSCULAR | Status: DC | PRN
  Administered 2018-02-08: 100 ug via EPIDURAL

## 2018-02-08 MED ORDER — LACTATED RINGERS IV SOLN: 500.0000 mL | INTRAVENOUS | Status: DC | PRN

## 2018-02-08 MED ORDER — ACETAMINOPHEN 325 MG PO TABS: 650.0000 mg | ORAL_TABLET | ORAL | Status: DC | PRN

## 2018-02-08 MED ORDER — PRENATAL MULTIVITAMIN CH
1.0000 | ORAL_TABLET | Freq: Every day | ORAL | Status: DC
  Administered 2018-02-09 – 2018-02-10 (×2): 1 via ORAL
  Filled 2018-02-08 (×2): qty 1

## 2018-02-08 MED ORDER — FENTANYL 2.5 MCG/ML BUPIVACAINE 1/10 % EPIDURAL INFUSION (WH - ANES)
14.0000 mL/h | INTRAMUSCULAR | Status: DC | PRN
  Administered 2018-02-08: 14 mL/h via EPIDURAL
  Filled 2018-02-08: qty 100

## 2018-02-08 MED ORDER — FENTANYL CITRATE (PF) 100 MCG/2ML IJ SOLN
INTRAMUSCULAR | Status: AC
  Filled 2018-02-08: qty 2

## 2018-02-08 MED ORDER — TETANUS-DIPHTH-ACELL PERTUSSIS 5-2.5-18.5 LF-MCG/0.5 IM SUSP
0.5000 mL | Freq: Once | INTRAMUSCULAR | Status: DC
  Filled 2018-02-08: qty 0.5

## 2018-02-08 MED ORDER — SIMETHICONE 80 MG PO CHEW: 80.0000 mg | CHEWABLE_TABLET | ORAL | Status: DC | PRN

## 2018-02-08 MED ORDER — LACTATED RINGERS IV SOLN
500.0000 mL | Freq: Once | INTRAVENOUS | Status: AC
  Administered 2018-02-08: 500 mL via INTRAVENOUS

## 2018-02-08 MED ORDER — OXYTOCIN 40 UNITS IN LACTATED RINGERS INFUSION - SIMPLE MED
2.5000 [IU]/h | INTRAVENOUS | Status: DC
  Administered 2018-02-08: 2.5 [IU]/h via INTRAVENOUS
  Filled 2018-02-08: qty 1000

## 2018-02-08 MED ORDER — EPHEDRINE 5 MG/ML INJ: 10.0000 mg | INTRAVENOUS | Status: DC | PRN

## 2018-02-08 MED ORDER — SENNOSIDES-DOCUSATE SODIUM 8.6-50 MG PO TABS
2.0000 | ORAL_TABLET | ORAL | Status: DC
  Administered 2018-02-08 – 2018-02-09 (×2): 2 via ORAL
  Filled 2018-02-08 (×2): qty 2

## 2018-02-08 MED ORDER — PHENYLEPHRINE 40 MCG/ML (10ML) SYRINGE FOR IV PUSH (FOR BLOOD PRESSURE SUPPORT)
80.0000 ug | PREFILLED_SYRINGE | INTRAVENOUS | Status: DC | PRN
  Filled 2018-02-08: qty 10

## 2018-02-08 MED ORDER — FLEET ENEMA 7-19 GM/118ML RE ENEM: 1.0000 | ENEMA | RECTAL | Status: DC | PRN

## 2018-02-08 MED ORDER — OXYCODONE-ACETAMINOPHEN 5-325 MG PO TABS: 2.0000 | ORAL_TABLET | ORAL | Status: DC | PRN

## 2018-02-08 MED ORDER — DIPHENHYDRAMINE HCL 25 MG PO CAPS: 25.0000 mg | ORAL_CAPSULE | Freq: Four times a day (QID) | ORAL | Status: DC | PRN

## 2018-02-08 MED ORDER — DIBUCAINE 1 % RE OINT
1.0000 "application " | TOPICAL_OINTMENT | RECTAL | Status: DC | PRN
  Filled 2018-02-08: qty 28

## 2018-02-08 MED ORDER — IBUPROFEN 600 MG PO TABS
600.0000 mg | ORAL_TABLET | Freq: Four times a day (QID) | ORAL | Status: DC
  Administered 2018-02-08 – 2018-02-10 (×7): 600 mg via ORAL
  Filled 2018-02-08 (×7): qty 1

## 2018-02-08 MED ORDER — ONDANSETRON HCL 4 MG/2ML IJ SOLN: 4.0000 mg | INTRAMUSCULAR | Status: DC | PRN

## 2018-02-08 MED ORDER — LACTATED RINGERS IV SOLN: 500.0000 mL | Freq: Once | INTRAVENOUS | Status: DC

## 2018-02-08 MED ORDER — WITCH HAZEL-GLYCERIN EX PADS: 1.0000 "application " | MEDICATED_PAD | CUTANEOUS | Status: DC | PRN

## 2018-02-08 MED ORDER — BUPIVACAINE HCL (PF) 0.25 % IJ SOLN
INTRAMUSCULAR | Status: DC | PRN
  Administered 2018-02-08: 8 mL via EPIDURAL

## 2018-02-08 MED ORDER — DOCUSATE SODIUM 100 MG PO CAPS
100.0000 mg | ORAL_CAPSULE | Freq: Two times a day (BID) | ORAL | Status: DC
  Administered 2018-02-08 – 2018-02-10 (×4): 100 mg via ORAL
  Filled 2018-02-08 (×4): qty 1

## 2018-02-08 MED ORDER — OXYCODONE-ACETAMINOPHEN 5-325 MG PO TABS
1.0000 | ORAL_TABLET | ORAL | Status: DC | PRN
  Administered 2018-02-09: 1 via ORAL
  Filled 2018-02-08: qty 1

## 2018-02-08 MED ORDER — LIDOCAINE HCL (PF) 1 % IJ SOLN
INTRAMUSCULAR | Status: DC | PRN
  Administered 2018-02-08: 6 mL via EPIDURAL

## 2018-02-08 MED ORDER — ZOLPIDEM TARTRATE 5 MG PO TABS: 5.0000 mg | ORAL_TABLET | Freq: Every evening | ORAL | Status: DC | PRN

## 2018-02-08 MED ORDER — DIPHENHYDRAMINE HCL 50 MG/ML IJ SOLN
12.5000 mg | INTRAMUSCULAR | Status: DC | PRN
  Administered 2018-02-08 (×2): 12.5 mg via INTRAVENOUS
  Filled 2018-02-08: qty 1

## 2018-02-08 MED ORDER — SOD CITRATE-CITRIC ACID 500-334 MG/5ML PO SOLN: 30.0000 mL | ORAL | Status: DC | PRN

## 2018-02-08 MED ORDER — PHENYLEPHRINE 40 MCG/ML (10ML) SYRINGE FOR IV PUSH (FOR BLOOD PRESSURE SUPPORT): 80.0000 ug | PREFILLED_SYRINGE | INTRAVENOUS | Status: DC | PRN

## 2018-02-08 MED ORDER — TERBUTALINE SULFATE 1 MG/ML IJ SOLN: 0.2500 mg | Freq: Once | INTRAMUSCULAR | Status: DC | PRN

## 2018-02-08 MED ORDER — ONDANSETRON HCL 4 MG PO TABS: 4.0000 mg | ORAL_TABLET | ORAL | Status: DC | PRN

## 2018-02-08 MED ORDER — ONDANSETRON HCL 4 MG/2ML IJ SOLN: 4.0000 mg | Freq: Four times a day (QID) | INTRAMUSCULAR | Status: DC | PRN

## 2018-02-08 MED ORDER — LACTATED RINGERS IV SOLN
INTRAVENOUS | Status: DC
  Administered 2018-02-08 (×2): via INTRAVENOUS

## 2018-02-08 MED ORDER — OXYTOCIN 40 UNITS IN LACTATED RINGERS INFUSION - SIMPLE MED
1.0000 m[IU]/min | INTRAVENOUS | Status: DC
  Administered 2018-02-08: 2 m[IU]/min via INTRAVENOUS

## 2018-02-08 MED ORDER — OXYCODONE-ACETAMINOPHEN 5-325 MG PO TABS: 1.0000 | ORAL_TABLET | ORAL | Status: DC | PRN

## 2018-02-08 NOTE — MAU Note (Signed)
Urine in lab 

## 2018-02-08 NOTE — Anesthesia Preprocedure Evaluation (Signed)
Anesthesia Evaluation  Patient identified by MRN, date of birth, ID band Patient awake    Airway Mallampati: II  TM Distance: >3 FB Neck ROM: Full    Dental no notable dental hx.    Pulmonary neg pulmonary ROS,    Pulmonary exam normal breath sounds clear to auscultation       Cardiovascular negative cardio ROS Normal cardiovascular exam Rhythm:Regular Rate:Normal     Neuro/Psych    GI/Hepatic   Endo/Other    Renal/GU      Musculoskeletal   Abdominal   Peds  Hematology   Anesthesia Other Findings   Reproductive/Obstetrics (+) Pregnancy                             Lab Results  Component Value Date   WBC 16.3 (H) 02/08/2018   HGB 11.8 (L) 02/08/2018   HCT 35.0 (L) 02/08/2018   MCV 91.4 02/08/2018   PLT 240 02/08/2018    Anesthesia Physical Anesthesia Plan  ASA: II  Anesthesia Plan: Epidural   Post-op Pain Management:    Induction:   PONV Risk Score and Plan:   Airway Management Planned:   Additional Equipment:   Intra-op Plan:   Post-operative Plan:   Informed Consent: I have reviewed the patients History and Physical, chart, labs and discussed the procedure including the risks, benefits and alternatives for the proposed anesthesia with the patient or authorized representative who has indicated his/her understanding and acceptance.     Plan Discussed with:   Anesthesia Plan Comments:         Anesthesia Quick Evaluation

## 2018-02-08 NOTE — H&P (Signed)
27 y.o. 236w3d  G2P1001 comes in c/o LOF since 2-3am.  Otherwise has good fetal movement and no bleeding.  Past Medical History:  Diagnosis Date  . Medical history non-contributory     Past Surgical History:  Procedure Laterality Date  . WISDOM TOOTH EXTRACTION      OB History  Gravida Para Term Preterm AB Living  2 1 1  0 0 1  SAB TAB Ectopic Multiple Live Births  0 0 0   1    # Outcome Date GA Lbr Len/2nd Weight Sex Delivery Anes PTL Lv  2 Current           1 Term 10/18/14 2675w1d 03:00 / 05:25 7 lb 10.9 oz (3.485 kg) M Vag-Spont EPI  LIV     Birth Comments: Born over term via vaginal delivery.  Per father no complications during the pregnancy.  After birth the NICU team had to provide some rescue breaths, no stay in the NICU, home with parents after a couple days.      Social History   Socioeconomic History  . Marital status: Married    Spouse name: Not on file  . Number of children: Not on file  . Years of education: Not on file  . Highest education level: Not on file  Social Needs  . Financial resource strain: Not on file  . Food insecurity - worry: Not on file  . Food insecurity - inability: Not on file  . Transportation needs - medical: Not on file  . Transportation needs - non-medical: Not on file  Occupational History  . Not on file  Tobacco Use  . Smoking status: Never Smoker  . Smokeless tobacco: Never Used  Substance and Sexual Activity  . Alcohol use: No  . Drug use: No  . Sexual activity: Yes  Other Topics Concern  . Not on file  Social History Narrative   ** Merged History Encounter **       Patient has no known allergies.    Prenatal Transfer Tool  Maternal Diabetes: No Genetic Screening: Normal Maternal Ultrasounds/Referrals: Abnormal:  Findings:   Other:  Possible velamentous cord insertion Fetal Ultrasounds or other Referrals:  Referred to Materal Fetal Medicine f/u for cord: NO velamentous cord Maternal Substance Abuse:  No Significant  Maternal Medications:  None Significant Maternal Lab Results: Lab values include: Group B Strep negative  Other PNC: uncomplicated.    Vitals:   02/08/18 1018 02/08/18 1126 02/08/18 1130  BP: 90/64  (!) 105/50  Pulse: 73  80  Resp: 18  17  Temp: 98.8 F (37.1 C)  98.3 F (36.8 C)  TempSrc: Oral  Oral  SpO2: 98%    Weight: 132 lb (59.9 kg) 132 lb (59.9 kg)   Height: 5\' 2"  (1.575 m) 5\' 2"  (1.575 m)     Lungs/Cor:  NAD Abdomen:  soft, gravid Ex:  no cords, erythema SVE:  3/50/-2 FHTs:  145, good STV, NST R Toco:  q 5-6   A/P   Admit for ROM at term  GBS Neg  Ruptured 9 hours, will start pitocin 2x2    Jessica Heath

## 2018-02-08 NOTE — MAU Provider Note (Signed)
S: Ms. Jessica Heath is a 27 y.o. G2P1001 at 2392w3d  who presents to MAU today complaining of leaking of fluid since 2:15 am. She denies vaginal bleeding. She endorses contractions. She reports normal fetal movement.    O: BP 90/64 (BP Location: Right Arm)   Pulse 73   Temp 98.8 F (37.1 C) (Oral)   Resp 18   Ht 5\' 2"  (1.575 m)   Wt 132 lb (59.9 kg)   LMP 05/03/2017 (LMP Unknown)   SpO2 98%   BMI 24.14 kg/m  GENERAL: Well-developed, well-nourished female in no acute distress.  HEAD: Normocephalic, atraumatic.  CHEST: Normal effort of breathing, regular heart rate ABDOMEN: Soft, nontender, gravid PELVIC: Normal external female genitalia. Vagina is pink and rugated. Cervix with normal contour, no lesions. Normal discharge.  Positive pooling.   Ferning slide positive  Cervical exam:  Dilation: 3 Effacement (%): 50 Cervical Position: Posterior Station: -2 Presentation: Vertex Exam by:: Jessica Heath CNM   Fetal Monitoring: Baseline: 145 Variability: moderate Accelerations: present Decelerations: none Contractions: every 4-5 minutes, mild to palpation  No results found for this or any previous visit (from the past 24 hour(s)).   A: SIUP at 4892w3d  SROM  Early labor with onset after SROM  P: Report given to RN to contact MD on call for further instructions  Hurshel PartyLeftwich-Kirby, Kaedan Richert A, CNM 02/08/2018 11:01 AM

## 2018-02-08 NOTE — Anesthesia Pain Management Evaluation Note (Signed)
  CRNA Pain Management Visit Note  Patient: Jessica Heath, 27 y.o., female  "Hello I am a member of the anesthesia team at [facility]. We have an anesthesia team available at all times to provide care throughout the hospital, including epidural management and anesthesia for C-section. I don't know your plan for the delivery whether it a natural birth, water birth, IV sedation, nitrous supplementation, doula or epidural, but we want to meet your pain goals."   1.Was your pain managed to your expectations on prior hospitalizations?   Yes   2.What is your expectation for pain management during this hospitalization?     Epidural  3.How can we help you reach that goal?   Record the patient's initial score and the patient's pain goal.   Pain: 4  Pain Goal: 7 [facility] wants you to be able to say your pain was always managed very well.  Laban EmperorMalinova,Dracen Reigle Hristova 02/08/2018

## 2018-02-08 NOTE — MAU Note (Signed)
Pt reports leaking fluid since 2 am.

## 2018-02-08 NOTE — Anesthesia Procedure Notes (Signed)
Epidural Patient location during procedure: OB Start time: 02/08/2018 2:08 PM End time: 02/08/2018 2:16 PM  Staffing Anesthesiologist: Trevor IhaHouser, Iveth Heidemann A, MD Performed: anesthesiologist   Preanesthetic Checklist Completed: patient identified, site marked, surgical consent, pre-op evaluation, timeout performed, IV checked, risks and benefits discussed and monitors and equipment checked  Epidural Patient position: sitting Prep: site prepped and draped and DuraPrep Patient monitoring: continuous pulse ox and blood pressure Approach: midline Injection technique: LOR air  Needle:  Needle type: Tuohy  Needle gauge: 17 G Needle length: 9 cm and 9 Needle insertion depth: 5 cm cm Catheter type: closed end flexible Catheter size: 19 Gauge Catheter at skin depth: 10 cm Test dose: negative  Assessment Events: blood not aspirated, injection not painful, no injection resistance, negative IV test and no paresthesia

## 2018-02-09 ENCOUNTER — Inpatient Hospital Stay (HOSPITAL_COMMUNITY): Payer: Commercial Managed Care - PPO

## 2018-02-09 LAB — CBC
HCT: 30.2 % — ABNORMAL LOW (ref 36.0–46.0)
Hemoglobin: 10.3 g/dL — ABNORMAL LOW (ref 12.0–15.0)
MCH: 31.2 pg (ref 26.0–34.0)
MCHC: 34.1 g/dL (ref 30.0–36.0)
MCV: 91.5 fL (ref 78.0–100.0)
PLATELETS: 194 10*3/uL (ref 150–400)
RBC: 3.3 MIL/uL — ABNORMAL LOW (ref 3.87–5.11)
RDW: 12.9 % (ref 11.5–15.5)
WBC: 17.2 10*3/uL — AB (ref 4.0–10.5)

## 2018-02-09 LAB — RPR: RPR Ser Ql: NONREACTIVE

## 2018-02-09 NOTE — Progress Notes (Addendum)
Patient reassessed and patient sitting on couch breast feeding. Patient still very guarded and in pain stating 9 out of 10. Notified Dr. Henderson CloudHorvath who ordered O2 saturation, which was 98%. Dr. Henderson CloudHorvath also ordered chest xray and stated she would round on patient. Order placed and patient wheeled to radiology per NT. Will continue to monitor. Earl Galasborne, Linda HedgesStefanie Surfside BeachHudspeth

## 2018-02-09 NOTE — Progress Notes (Signed)
CXR is negative for concern and Sats are normal.  Likely musculo-skeletal pain.

## 2018-02-09 NOTE — Lactation Note (Signed)
This note was copied from a baby's chart. Lactation Consultation Note Baby 8 hrs old. Mom stated BF going well. Baby BF for a long time for the last feeding but doesn't want to feed at this time. Encouraged mom STS, unwrap baby, check diaper,hand express and spoon feed to stimulate baby to BF. Mom encouraged to waken baby for feeds if hasn't cued in 3 hrs. Mom encouraged to feed baby 8-12 times/24 hours and with feeding cues.  Mom has 27 yr old at home that she BF for 4 months w/o difficulty.  Mom has everted nipples. Mom states she has colostrum.  Encouraged to call for assistance if needed or has questions.  [facility]/LC brochure given w/resources, support groups and LC services.  Patient Name: Jessica Emelia SalisburyKylynn Heath ZOXWR'UToday's Date: 02/09/2018 Reason for consult: Initial assessment   Maternal Data Has patient been taught Hand Expression?: Yes Does the patient have breastfeeding experience prior to this delivery?: Yes  Feeding Feeding Type: Breast Fed Length of feed: 0 min  LATCH Score Latch: Too sleepy or reluctant, no latch achieved, no sucking elicited.     Type of Nipple: Everted at rest and after stimulation  Comfort (Breast/Nipple): Soft / non-tender  Hold (Positioning): Assistance needed to correctly position infant at breast and maintain latch.     Interventions Interventions: Breast feeding basics reviewed;Assisted with latch;Breast compression;Skin to skin;Adjust position;Support pillows;Hand express;Position options  Lactation Tools Discussed/Used     Consult Status Consult Status: Follow-up Date: 02/09/18 Follow-up type: In-patient    Charyl DancerCARVER, Takeyah Wieman G 02/09/2018, 3:47 AM

## 2018-02-09 NOTE — Progress Notes (Signed)
Patient is eating, ambulating, voiding.  Pain control is good.  Vitals:   02/08/18 2117 02/08/18 2130 02/08/18 2230 02/09/18 0230  BP: 105/62 102/60 (!) 103/59 98/62  Pulse: 90 89 85 67  Resp: 18 16  16   Temp:  98 F (36.7 C) 98.5 F (36.9 C) 98.7 F (37.1 C)  TempSrc:   Axillary Axillary  SpO2:  100% 99% 99%  Weight:      Height:        Fundus firm Perineum without swelling.  Lab Results  Component Value Date   WBC 17.2 (H) 02/09/2018   HGB 10.3 (L) 02/09/2018   HCT 30.2 (L) 02/09/2018   MCV 91.5 02/09/2018   PLT 194 02/09/2018    --/--/O POS, O POS Performed at [facility], [address] [place], [place] [address]  (02/27 1122)/[place]  A/P Post partum day 1.  Routine care.  Expect d/c routine.    Keonda Dow A

## 2018-02-09 NOTE — Discharge Summary (Addendum)
Obstetric Discharge Summary Reason for Admission: onset of labor and rupture of membranes Prenatal Procedures: NST Intrapartum Procedures: spontaneous vaginal delivery Postpartum Procedures: none Complications-Operative and Postpartum: none Hemoglobin  Date Value Ref Range Status  02/09/2018 10.3 (L) 12.0 - 15.0 g/dL Final   HCT  Date Value Ref Range Status  02/09/2018 30.2 (L) 36.0 - 46.0 % Final     Discharge Diagnoses: Term Pregnancy-delivered  Discharge Information: Date: 02/09/2018 Activity: pelvic rest Diet: routine Medications: Ibuprofen, oxycodone Condition: stable Instructions: refer to practice specific booklet Discharge to: home Follow-up Information    Philip AspenCallahan, Sidney, DO Follow up in 4 week(s).   Specialty:  Obstetrics and Gynecology Contact information: [address] [place] [address] [phone]           Newborn Data: Live born female  Birth Weight: 6 lb 11.2 oz (3039 g) APGAR: 9, 9  Newborn Delivery   Birth date/time:  02/08/2018 19:25:00 Delivery type:  Vaginal, Spontaneous     Home with mother.  HORVATH,MICHELLE A 02/09/2018, 6:46 AM

## 2018-02-09 NOTE — Anesthesia Postprocedure Evaluation (Signed)
Anesthesia Post Note  Patient: Jessica Heath  Procedure(s) Performed: AN AD HOC LABOR EPIDURAL     Patient location during evaluation: Mother Baby Anesthesia Type: Epidural Level of consciousness: awake and alert and oriented Pain management: satisfactory to patient Vital Signs Assessment: post-procedure vital signs reviewed and stable Respiratory status: respiratory function stable Cardiovascular status: stable Postop Assessment: no headache, no backache, epidural receding, patient able to bend at knees, no signs of nausea or vomiting and adequate PO intake Anesthetic complications: no    Last Vitals:  Vitals:   02/08/18 2230 02/09/18 0230  BP: (!) 103/59 98/62  Pulse: 85 67  Resp:  16  Temp: 36.9 C 37.1 C  SpO2: 99% 99%    Last Pain:  Vitals:   02/09/18 0230  TempSrc: Axillary  PainSc:    Pain Goal:                 Skip Litke

## 2018-02-09 NOTE — Plan of Care (Signed)
  Role Relationship: Ability to demonstrate positive interaction with newborn will improve 02/09/2018 1005 - Completed/Met by Tollie Eth, RN Note Assisted mother to latch baby. Discussed proper positioning and signs of appropriate latch.

## 2018-02-09 NOTE — Plan of Care (Signed)
  Education: Knowledge of condition will improve 02/09/2018 1156 by Karn Cassis, RN Note Around 1100, patient's significant other called RN to room stating that patient felt a pop in her ribs and now cannot move without pain. Entered room and patient was tearful and guarded on her left side and would not move. Patient stated she was getting back in bed after her shower and "moved the wrong way and felt a pop" in her upper left lateral rib cage. Patient stated that during the end of her pregnancy she had a cough and had become sore in that area. Now patient states she has pain of 9 out of 10 when she moves or tries to breathe deeply. Offered patient percocet in which she declined. Patient not having shortness of breath and lungs sound clear. Called and notified Dr. Henderson Cloud in which she ordered to give patient percocet, apply heating pad and have patient to rest. Dr. Henderson Cloud ordered to call her back in two hours and if not significantly better, may consider chest xray. Patient called out again around 1120 in which RN was busy in another room unable to come out. Patient stated she heard gurgling when she breathed. Charge RN, Odis Luster, assessed patient and stated lungs sounded clear and patient in no distress. Patient's RN went back in room around 1145 to check after update from charge RN. Lungs clear and patient states she has no pain just lying still; however, her pain is about 6-7 when coughing or moving. Percocet has slightly been effective. Provided scheduled ibuprofen, encouraged patient to keep Kpad in place and rest. Discussed with charge RN and neither charge RN or patient's RN heard gurgling with patient's breathing. Charge and patient's RN question whether patient heard gurgling from Kpad filling up with water and circulating because Kpad does make similar noise. Will continue to monitor closely. Earl Gala, Linda Hedges [place]

## 2018-02-10 MED ORDER — IBUPROFEN 600 MG PO TABS: 600.0000 mg | ORAL_TABLET | Freq: Four times a day (QID) | ORAL | 0 refills | Status: AC

## 2018-02-10 MED ORDER — OXYCODONE HCL 5 MG PO CAPS: 5.0000 mg | ORAL_CAPSULE | Freq: Four times a day (QID) | ORAL | 0 refills | Status: AC | PRN

## 2018-02-10 NOTE — Progress Notes (Signed)
Doing well.  Right rib pain is somewhat improved, but still painful with certain movements on right side. CXR yesterday neg for fracture / dislocation.  She is ambulating, voiding, tolerating PO.   Lochia is appropriate   Vitals:   02/09/18 0900 02/09/18 1319 02/09/18 1745 02/10/18 0536  BP: (!) 102/55  (!) 103/58 109/68  Pulse: 69  71 (!) 56  Resp: 16  16 16   Temp: 98.1 F (36.7 C)  98.2 F (36.8 C) 97.7 F (36.5 C)  TempSrc: Oral  Oral Oral  SpO2:  98% 100%   Weight:      Height:        NAD Fundus firm Ext: No edema  Lab Results  Component Value Date   WBC 17.2 (H) 02/09/2018   HGB 10.3 (L) 02/09/2018   HCT 30.2 (L) 02/09/2018   MCV 91.5 02/09/2018   PLT 194 02/09/2018    --/--/O POS, O POS Performed at [facility], [address] [place], [place] [address]  (02/27 1122)/[place]  A/P 27 y.o. [id number] PPD#2 s/p TSVD. Routine care.   D/c to home today--small rx oxycodone for severe MSK pain Britteney Ayotte GEFFEL Caeleb Batalla

## 2018-02-10 NOTE — Lactation Note (Signed)
This note was copied from a baby's chart. Lactation Consultation Note Mom upset d/t baby cluster feeding, requesting formula. Mom's nipples are pink. Mom stated its from using DEBP. Encouraged mom not to turn pump suction up so heard that it hurt. Mom stated she could only tolerate pumping 10 min. Mom stated she didn't get much out. RN explained cluster feeding, colostrum amount, consistency, etc.  Hand expressed 2ml colostrum. Mom has everted nipples, discussed positions for feeding.  Mom again asked about supplementing. Discussed supply and demand, giving colostrum and BF first. Discussed baby's stomach size and amount to supplement. Mom stated she still cont. To try but may need to get formula.   Patient Name: Girl Emelia SalisburyKylynn Loeza ZOXWR'UToday's Date: 02/10/2018 Reason for consult: Follow-up assessment   Maternal Data    Feeding Feeding Type: Breast Milk  LATCH Score          Comfort (Breast/Nipple): Filling, red/small blisters or bruises, mild/mod discomfort        Interventions Interventions: Breast feeding basics reviewed  Lactation Tools Discussed/Used Pump Review: Milk Storage Initiated by:: Prudy Feelerj. acevedo RN Date initiated:: 02/09/18   Consult Status Consult Status: Follow-up Date: 02/10/18 Follow-up type: In-patient    Jaritza Duignan, Diamond NickelLAURA G 02/10/2018, 2:07 AM

## 2018-02-10 NOTE — Lactation Note (Signed)
This note was copied from a baby's chart. Lactation Consultation Note  Patient Name: Jessica Heath ZOXWR'UToday's Date: 02/10/2018 Reason for consult: Follow-up assessment  Mom requested LC to answer questions on day of discharge.  Baby 37 hrs old, and at 6 % weight loss.  Mom placed baby STS in cross cradle hold.  Assisted Mom with using adequate support for baby and breast.  Mom encouraged to use U hold and bring baby onto breast when she opens widely.  Taught Mom how to use alternate breast compression to increase milk transfer.  Swallowing identified for Mom. Mom complaining of some soreness, no trauma noted on nipple.  Mom using coconut oil.  Encouraged STS and cue based feedings.  Goal of 8-12 feedings per 24 hrs.  Engorgement prevention and treatment discussed.  Mom aware of OP lactation services available to her. Encouraged to call prn.     Feeding Feeding Type: Breast Fed Length of feed: 10 min  LATCH Score Latch: Grasps breast easily, tongue down, lips flanged, rhythmical sucking.  Audible Swallowing: Spontaneous and intermittent  Type of Nipple: Everted at rest and after stimulation  Comfort (Breast/Nipple): Filling, red/small blisters or bruises, mild/mod discomfort  Hold (Positioning): Assistance needed to correctly position infant at breast and maintain latch.  LATCH Score: 8  Interventions Interventions: Breast feeding basics reviewed;Assisted with latch;Skin to skin;Breast massage;Breast compression;Adjust position;Support pillows;Position options;Coconut oil   Consult Status Consult Status: Complete Date: 02/10/18 Follow-up type: Call as needed    Judee ClaraSmith, Thatcher Doberstein E 02/10/2018, 9:02 AM

## 2018-02-11 ENCOUNTER — Ambulatory Visit: Payer: Self-pay

## 2018-02-11 NOTE — Lactation Note (Signed)
This note was copied from a baby's chart. Lactation Consultation Note  Patient Name: Jessica Heath  Mom reports feedings are going well.  Baby recently finished a feeding and she is content and relaxed.  Mom last post pumped 30 mls of transitional milk.  Syringe feeding expressed milk.  Engorgement prevention and treatment reviewed yesterday.  Lactation outpatient services and support reviewed and encouraged prn.   Maternal Data    Feeding    LATCH Score                   Interventions    Lactation Tools Discussed/Used     Consult Status      Huston FoleyMOULDEN, [name], 9:20 AM

## 2018-07-18 DIAGNOSIS — L709 Acne, unspecified: Secondary | ICD-10-CM | POA: Diagnosis not present

## 2018-07-18 DIAGNOSIS — Z1339 Encounter for screening examination for other mental health and behavioral disorders: Secondary | ICD-10-CM | POA: Diagnosis not present

## 2018-07-18 DIAGNOSIS — Z1331 Encounter for screening for depression: Secondary | ICD-10-CM | POA: Diagnosis not present

## 2018-07-18 DIAGNOSIS — F4323 Adjustment disorder with mixed anxiety and depressed mood: Secondary | ICD-10-CM | POA: Diagnosis not present

## 2018-07-28 DIAGNOSIS — Z124 Encounter for screening for malignant neoplasm of cervix: Secondary | ICD-10-CM | POA: Diagnosis not present

## 2018-07-28 DIAGNOSIS — Z01419 Encounter for gynecological examination (general) (routine) without abnormal findings: Secondary | ICD-10-CM | POA: Diagnosis not present

## 2018-07-28 DIAGNOSIS — Z681 Body mass index (BMI) 19 or less, adult: Secondary | ICD-10-CM | POA: Diagnosis not present

## 2018-08-15 DIAGNOSIS — F4323 Adjustment disorder with mixed anxiety and depressed mood: Secondary | ICD-10-CM | POA: Diagnosis not present

## 2018-08-15 DIAGNOSIS — L709 Acne, unspecified: Secondary | ICD-10-CM | POA: Diagnosis not present

## 2018-08-15 DIAGNOSIS — Z681 Body mass index (BMI) 19 or less, adult: Secondary | ICD-10-CM | POA: Diagnosis not present

## 2019-06-08 DIAGNOSIS — F4323 Adjustment disorder with mixed anxiety and depressed mood: Secondary | ICD-10-CM | POA: Diagnosis not present

## 2019-06-08 DIAGNOSIS — Z6820 Body mass index (BMI) 20.0-20.9, adult: Secondary | ICD-10-CM | POA: Diagnosis not present

## 2019-08-01 IMAGING — CR DG CHEST 2V
2 series · 2 of 2 positions shown · non-contrast
Comparison: None.

CLINICAL DATA: Felt a pop in LEFT chest, pain radiating to axilla.

EXAM:
CHEST  2 VIEW

[chest pa]
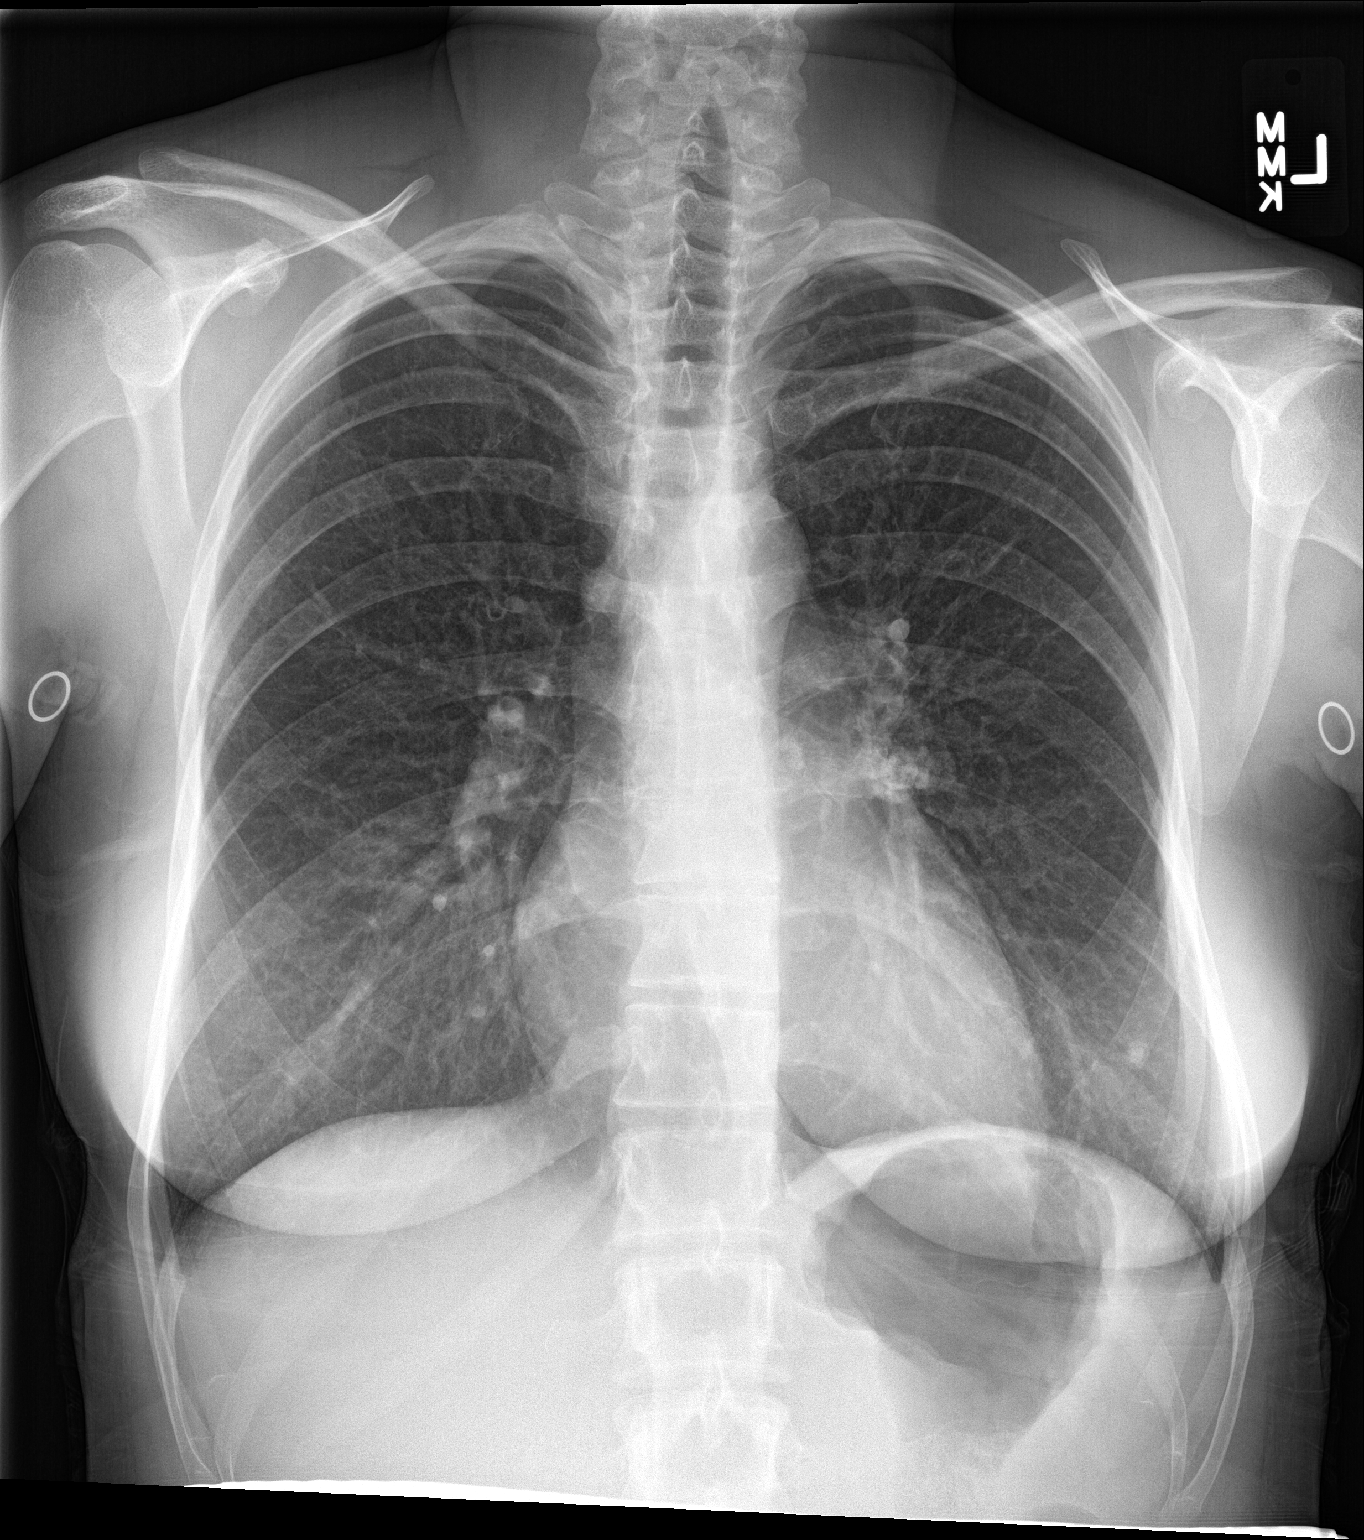

[chest lat]
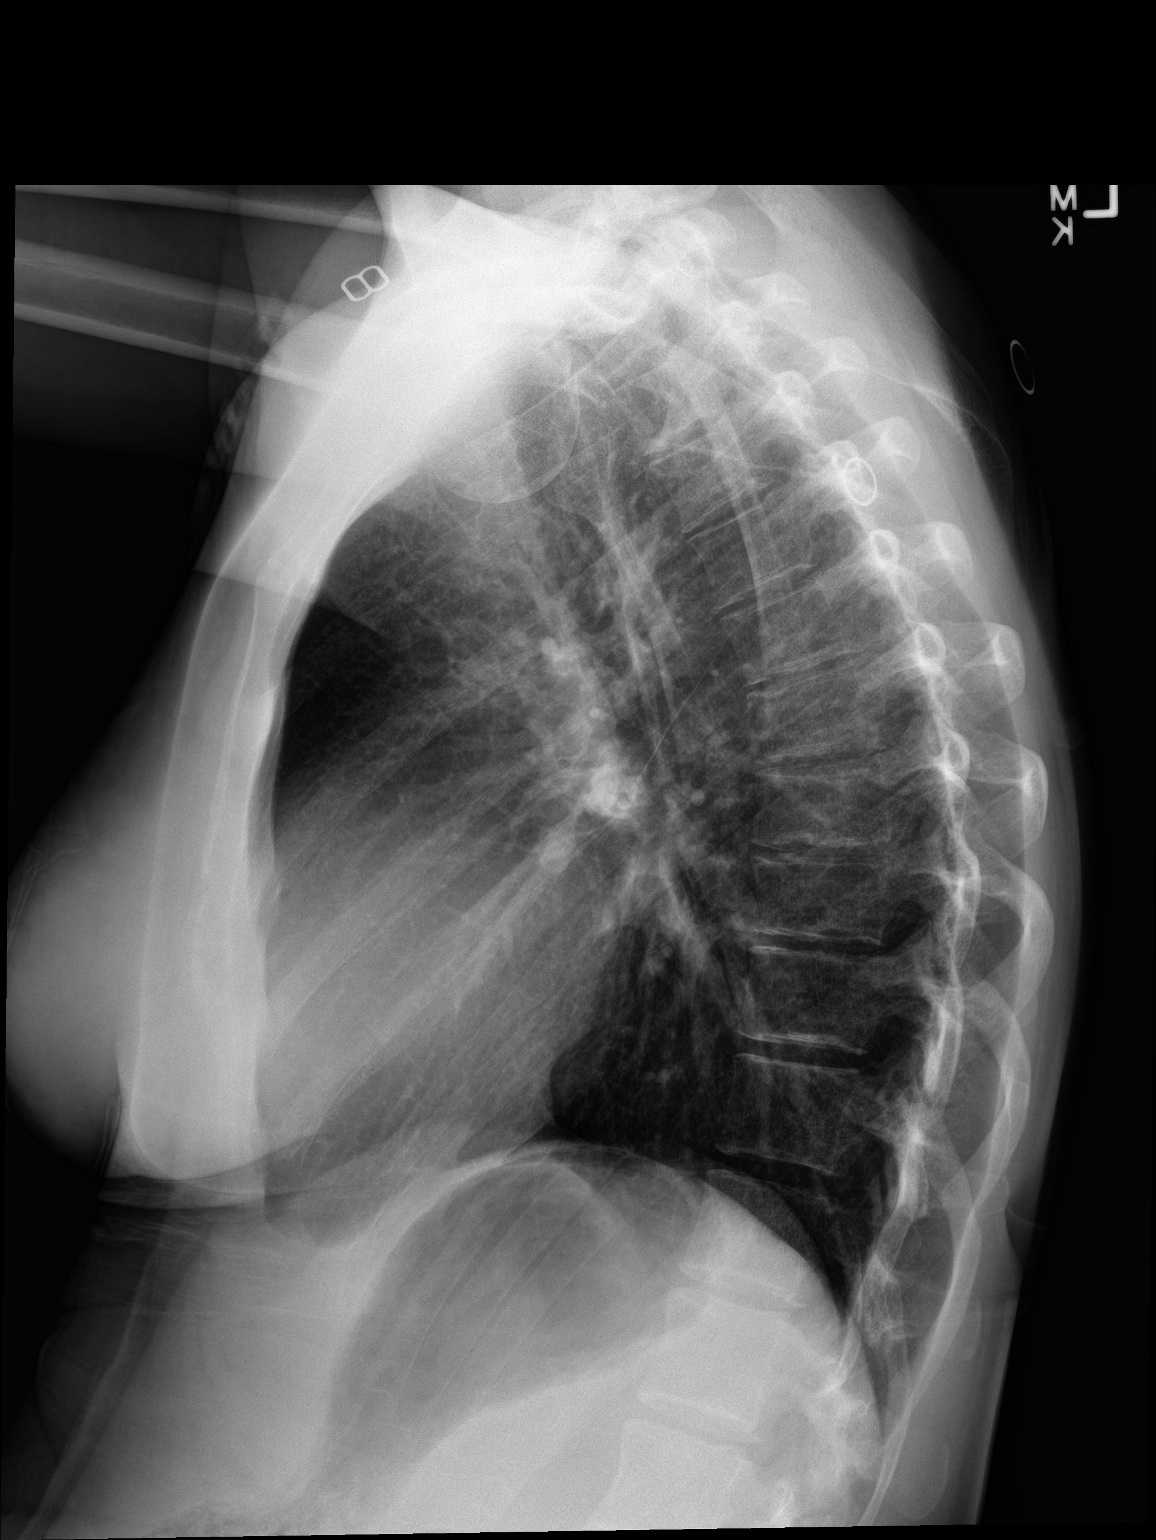

[2 of 2 positions shown; findings below may reference images not displayed]

FINDINGS: Cardiomediastinal silhouette is normal. No pleural effusions or
focal consolidations. Calcified LEFT hilar lymph nodes, LEFT lung
base granuloma. Mild bronchitic changes. Trachea projects midline
and there is no pneumothorax. Soft tissue planes and included
osseous structures are non-suspicious.
IMPRESSION: Mild bronchitic changes.

Old granulomatous disease.
